# Patient Record
Sex: Male | Born: 2000 | Race: White | Hispanic: No | Marital: Single | Smoking: Never smoker
Health system: Southern US, Community
[De-identification: ages and names within clinical notes are randomized; demographics above are authoritative.]

## PROBLEM LIST (undated history)

## (undated) DIAGNOSIS — S060XAA Concussion with loss of consciousness status unknown, initial encounter: Secondary | ICD-10-CM

## (undated) DIAGNOSIS — S060X9A Concussion with loss of consciousness of unspecified duration, initial encounter: Secondary | ICD-10-CM

## (undated) HISTORY — PX: ADENOIDECTOMY: SHX5191

## (undated) HISTORY — PX: TYMPANOSTOMY TUBE PLACEMENT: SHX32

## (undated) HISTORY — PX: TONSILLECTOMY: SUR1361

## (undated) HISTORY — PX: CIRCUMCISION: SUR203

---
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
## 2022-01-03 ENCOUNTER — Ambulatory Visit: Payer: Self-pay | Admitting: Behavioral Health

## 2022-01-09 ENCOUNTER — Ambulatory Visit (INDEPENDENT_AMBULATORY_CARE_PROVIDER_SITE_OTHER): Payer: 59 | Admitting: Mental Health

## 2022-01-09 ENCOUNTER — Other Ambulatory Visit: Payer: Self-pay

## 2022-01-09 DIAGNOSIS — F331 Major depressive disorder, recurrent, moderate: Secondary | ICD-10-CM | POA: Diagnosis not present
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
## 2022-01-23 ENCOUNTER — Ambulatory Visit: Payer: 59 | Admitting: Mental Health

## 2022-02-05 ENCOUNTER — Ambulatory Visit: Payer: 59 | Admitting: Mental Health

## 2022-02-19 ENCOUNTER — Ambulatory Visit: Payer: 59 | Admitting: Mental Health

## 2022-02-27 ENCOUNTER — Ambulatory Visit: Payer: 59 | Admitting: Mental Health

## 2022-03-12 ENCOUNTER — Other Ambulatory Visit: Payer: Self-pay

## 2022-03-12 ENCOUNTER — Ambulatory Visit (INDEPENDENT_AMBULATORY_CARE_PROVIDER_SITE_OTHER): Payer: 59 | Admitting: Mental Health

## 2022-03-12 DIAGNOSIS — F39 Unspecified mood [affective] disorder: Secondary | ICD-10-CM | POA: Diagnosis not present

## 2022-03-12 DIAGNOSIS — F331 Major depressive disorder, recurrent, moderate: Secondary | ICD-10-CM

## 2022-03-13 NOTE — Progress Notes (Signed)
?    [facility] Counselor/Therapist Progress Note ? ? ?Patient ID: Francisco Harmon, Francisco Harmon"     MRN: [id number] ? ?Date: 03/12/22 ? ?Timespent: 51 minutes ? ?Treatment Type: Individual Therapy ? ?Interventions:Solution Focused, Strength-based and Supportive ? ?Mental Status Exam: ?  ? ?Appearance:   Casual     ?Behavior:  Appropriate  ?Motor:  Normal  ?Speech/Language:   Normal Rate  ?Affect:    full range  ?Mood:   euthymic  ?Thought process:  Coherent and Relevant  ?Thought content:    Logical  ?Perceptual disturbances:    None  ?Orientation:  Full (Time, Place, and Person)  ?Attention:  Good  ?Concentration:  good  ?Memory:  Recent  ?Fund of knowledge:   Good  ?Insight:     good  ?Judgment:    Good  ?Impulse Control:   good  ? ?Reported Symptoms: mood swings w/ depressed mood, hopeless/helpless feelings,negative self talk, low motivation, anxiety, low self-esteem, irritability, grandiosity, impulsivity, emotional numbing ? ?Risk Assessment: ?Danger to Self:  No ?Self-injurious Behavior: No ?Danger to Others: No ?Duty to Warn:no ?Physical Aggression / Violence:No  ?Access to Firearms a concern: No  ?Gang Involvement:No   ?Patient / guardian was educated about steps to take if suicide or homicide risk level increases between visits. ?While future psychiatric events cannot be accurately predicted, the patient does not currently require acute inpatient psychiatric care and does not currently meet [facility] involuntary commitment criteria. ? ? ?Subjective  Patient presents on time for today session.  Patient shared events since last visit.  He stated that he is getting along better with his family, continues to live with his biological father, stepmother and stepsisters.  He shared many events they shared together, relationship changes which he feels at this point are moving in a positive direction.  He stated he is now working up to 30 hours/week at a [employer], while also attending and completing  his high school course work towards graduation.  He continues to be excited about attending college next fall.  He stated that he has discontinued taking his medication as he felt that he was more irritable as a result.  He stated that he has had no significant mood instability of concern, no severe depressive episodes.  States that he felt "manic" a few weeks ago but was able to ground himself by utilizing some cognitive skills as discussed in past sessions, reframing.  He continues to use cannabis a few times per week, states that he feels it is helpful but does not want to use it daily.  He went on to share how he set a boundary with one of his past friends going into detail about how he was able to be more assertive and "not get run over by people" as he stated this would often occur in the past.  Encouraged self-care, getting adequate rest and reviewed continued journaling as well as reaching out to this office between sessions if needed. ? ?Interventions: Strength-based and Supportive therapy ? ?Diagnosis: ?  ICD-10-CM   ?1. Major depressive disorder, recurrent episode, moderate (HCC)  F33.1   ?  ?2. 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  ? ? ? ? ?Plan:   Patient to utilize his support system if symptoms worsen between symptoms as well as contacting this office if needed between sessions. Paitent to continue to identify thoughts that increase his feelings depressed/anxious.  Continue to utilize support system. ? ?Long-term goal:   ?  Reduce overall level, frequency, and intensity of the feelings of depression and anxiety in severity for at least 3 consecutive months w/ a decrease in symptoms and mood instability.  ?  ?Short-term goal:  ?Engage in psychiatric assessment  ?Decrease negative self talk associated w/ depression and anxious mood states ?Increase mood stability by engaging in a consistent sleep and dietary routine ?Journal between sessions ?Utilize his support system as needed between sessions and  contact this office with immediate needs or concerns ?  ?Assessment of progress:  progressing ? ?Anson Oregon, [facility] ? ? ? ? ?

## 2022-03-27 ENCOUNTER — Ambulatory Visit: Payer: 59 | Admitting: Mental Health

## 2022-03-28 NOTE — Progress Notes (Signed)
No charge for missed session. ?

## 2022-04-19 ENCOUNTER — Ambulatory Visit (INDEPENDENT_AMBULATORY_CARE_PROVIDER_SITE_OTHER): Payer: 59 | Admitting: Mental Health

## 2022-04-19 DIAGNOSIS — F39 Unspecified mood [affective] disorder: Secondary | ICD-10-CM | POA: Diagnosis not present

## 2022-04-19 NOTE — Progress Notes (Signed)
?    [facility] Counselor/Therapist Progress Note ? ? ?Patient ID: Francisco Harmon, Francisco Harmon"     MRN: [id number] ? ?Date: 04/20/22 ? ?Timespent: 52 minutes ? ?Treatment Type: Individual Therapy ? ?Interventions:Solution Focused, Strength-based and Supportive ? ?Mental Status Exam: ?  ? ?Appearance:   Casual     ?Behavior:  Appropriate  ?Motor:  Normal  ?Speech/Language:   Normal Rate  ?Affect:    full range  ?Mood:   euthymic  ?Thought process:  Coherent and Relevant  ?Thought content:    Logical  ?Perceptual disturbances:    None  ?Orientation:  Full (Time, Place, and Person)  ?Attention:  Good  ?Concentration:  good  ?Memory:  Recent  ?Fund of knowledge:   Good  ?Insight:     good  ?Judgment:    Good  ?Impulse Control:   good  ? ?Reported Symptoms: mood swings w/ depressed mood, hopeless/helpless feelings,negative self talk, low motivation, anxiety, low self-esteem, irritability, grandiosity, impulsivity, emotional numbing ? ?Risk Assessment: ?Danger to Self:  No ?Self-injurious Behavior: No ?Danger to Others: No ?Duty to Warn:no ?Physical Aggression / Violence:No  ?Access to Firearms a concern: No  ?Gang Involvement:No   ?Patient / guardian was educated about steps to take if suicide or homicide risk level increases between visits. ?While future psychiatric events cannot be accurately predicted, the patient does not currently require acute inpatient psychiatric care and does not currently meet [facility] involuntary commitment criteria. ? ? ?Subjective  Patient presents on time for today session.  Patient shared recent events, progress.  He stated that he has felt "manic" describing his mood over the last few days.  He stated that he enjoyed going to prom with his friend, shared some experiences.  He states that he has been sleeping an average of about 4 hours per night, last night getting almost 8 hours.  Reports some elevated energy levels over the past few days.  Using cannabis at times to assist in  managing his mood towards a more relaxed calming state.  Shared relationships, getting along well with his closest friends, his best friend having a birthday today with which she is excited to give him his gift.  Denies feeling depressed recently, continues to keep up with academic demands and looks forward to next year attending college.  Facilitated his exploring and identifying some signs and symptoms where he can recognize if he is starting to feel a change in his mood toward having higher energy.  He stated that he tries to "check himself" from a cognitive standpoint before making decisions impulsively.  Encouraged him to continue to pause to identify thoughts related to his mood and the situations towards giving himself time to make decisions in a thoughtful manner. ? ?Interventions: Strength-based and Supportive therapy ? ?Diagnosis: ?  ICD-10-CM   ?1. 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  ? ? ? ?Plan:   Patient to utilize his support system if symptoms worsen between symptoms as well as contacting this office if needed between sessions. Paitent to continue to identify thoughts that increase his feelings depressed/anxious.  Continue to utilize support system. ? ?Long-term goal:   ?Reduce overall level, frequency, and intensity of the feelings of depression and anxiety in severity for at least 3 consecutive months w/ a decrease in symptoms and mood instability.  ?  ?Short-term goal:  ?Engage in psychiatric assessment  ?Decrease negative self talk associated w/ depression and anxious mood states ?Increase mood stability by engaging  in a consistent sleep and dietary routine ?Journal between sessions ?Utilize his support system as needed between sessions and contact this office with immediate needs or concerns ?  ?Assessment of progress:  progressing ? ?Anson [place], [facility] ? ? ? ? ?

## 2022-05-08 ENCOUNTER — Ambulatory Visit: Payer: 59 | Admitting: Mental Health

## 2022-05-30 ENCOUNTER — Ambulatory Visit (INDEPENDENT_AMBULATORY_CARE_PROVIDER_SITE_OTHER): Payer: Self-pay | Admitting: Mental Health

## 2022-05-30 DIAGNOSIS — F39 Unspecified mood [affective] disorder: Secondary | ICD-10-CM

## 2022-05-30 NOTE — Progress Notes (Signed)
No show charge for 05/30/22.

## 2022-06-18 ENCOUNTER — Ambulatory Visit (INDEPENDENT_AMBULATORY_CARE_PROVIDER_SITE_OTHER): Payer: 59 | Admitting: Mental Health

## 2022-06-18 DIAGNOSIS — F39 Unspecified mood [affective] disorder: Secondary | ICD-10-CM | POA: Diagnosis not present

## 2022-07-09 ENCOUNTER — Ambulatory Visit (INDEPENDENT_AMBULATORY_CARE_PROVIDER_SITE_OTHER): Payer: 59 | Admitting: Mental Health

## 2022-07-09 DIAGNOSIS — F39 Unspecified mood [affective] disorder: Secondary | ICD-10-CM

## 2022-07-09 NOTE — Progress Notes (Signed)
[facility] Counselor/Therapist Progress Note   Patient ID: Francisco Harmon, Francisco Harmon"     MRN: [id number]  Date: 07/09/22  Timespent: 54 minutes  Treatment Type: Individual Therapy  Interventions:Solution Focused, Strength-based and Supportive  Mental Status Exam:    Appearance:   Casual     Behavior:  Appropriate  Motor:  Normal  Speech/Language:   Normal Rate  Affect:    full range  Mood:   euthymic  Thought process:  Coherent and Relevant  Thought content:    Logical  Perceptual disturbances:    None  Orientation:  Full (Time, Place, and Person)  Attention:  Good  Concentration:  good  Memory:  Recent  Fund of knowledge:   Good  Insight:     good  Judgment:    Good  Impulse Control:   good   Reported Symptoms: mood swings w/ depressed mood, hopeless/helpless feelings,negative self talk, low motivation, anxiety, low self-esteem, irritability, grandiosity, impulsivity, emotional numbing  Risk Assessment: Danger to Self:  No Self-injurious Behavior: No Danger to Others: No Duty to Warn:no Physical Aggression / Violence:No  Access to Firearms a concern: No  Gang Involvement:No   Patient / guardian was educated about steps to take if suicide or homicide risk level increases between visits. While future psychiatric events cannot be accurately predicted, the patient does not currently require acute inpatient psychiatric care and does not currently meet [facility] involuntary commitment criteria.   Subjective  Patient presents on time for today session.  Patient shared recent progress.  He stated that he was able to go on to college campus to her and is looking forward to attending in the fall.  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  Assessed mood where he stated that he has been feeling more manic over the past several days, went on to state how he  takes time to recognize symptoms shared in session.  Patient stated that he continues to use cannabis at times and this helps to calm his mood.  He stated that his shift to his mood becoming more depressed is typically more consistent but has not been severe.  Family relationships were assessed along with peer interactions, specifically without of his girlfriend as they are now dating as a couple.  He continues to identify some anxiety related to when he goes to college and she goes to a different college in the fall, he is hopeful they can continue the relationship but also recognizes that change could occur.  Facilitated his identifying thoughts, feelings as well as needs throughout the session.   Interventions: Strength-based and Supportive therapy  Diagnosis:   ICD-10-CM   1. Episodic mood disorder (HCC)  F39          Plan:   Patient to utilize his support system if symptoms worsen between symptoms as well as contacting this office if needed between sessions. Paitent to continue to identify thoughts that increase his feelings depressed/anxious.  Continue to utilize support system.  Long-term goal:   Reduce overall level, frequency, and intensity of the feelings of depression and anxiety in severity for at least 3 consecutive months w/ a decrease in symptoms and mood instability.    Short-term goal:  Engage in psychiatric assessment  Decrease negative self talk associated w/ depression and anxious mood states Increase mood stability by engaging in a  consistent sleep and dietary routine Journal between sessions Utilize his support system as needed between sessions and contact this office with immediate needs or concerns   Assessment of progress:  progressing  [name] Session, [facility]

## 2022-07-26 ENCOUNTER — Ambulatory Visit: Payer: 59 | Admitting: Mental Health

## 2022-08-01 ENCOUNTER — Ambulatory Visit (INDEPENDENT_AMBULATORY_CARE_PROVIDER_SITE_OTHER): Payer: 59 | Admitting: Mental Health

## 2022-08-01 DIAGNOSIS — F39 Unspecified mood [affective] disorder: Secondary | ICD-10-CM

## 2022-09-09 NOTE — Progress Notes (Signed)
      [facility] Counselor/Therapist Progress Note   Patient ID: Francisco Harmon, Francisco Harmon"     MRN: [id number]  Date: 08/01/22  Timespent: 55 minutes  Treatment Type: Individual Therapy  Interventions:Solution Focused, Strength-based and Supportive  Mental Status Exam:    Appearance:   Casual     Behavior:  Appropriate  Motor:  Normal  Speech/Language:   Normal Rate  Affect:    full range  Mood:   euthymic  Thought process:  Coherent and Relevant  Thought content:    Logical  Perceptual disturbances:    None  Orientation:  Full (Time, Place, and Person)  Attention:  Good  Concentration:  good  Memory:  Recent  Fund of knowledge:   Good  Insight:     good  Judgment:    Good  Impulse Control:   good   Reported Symptoms: mood swings w/ depressed mood, hopeless/helpless feelings,negative self talk, low motivation, anxiety, low self-esteem, irritability, grandiosity, impulsivity, emotional numbing  Risk Assessment: Danger to Self:  No Self-injurious Behavior: No Danger to Others: No Duty to Warn:no Physical Aggression / Violence:No  Access to Firearms a concern: No  Gang Involvement:No   Patient / guardian was educated about steps to take if suicide or homicide risk level increases between visits. While future psychiatric events cannot be accurately predicted, the patient does not currently require acute inpatient psychiatric care and does not currently meet [facility] involuntary commitment criteria.   Subjective  Patient presents on time for today session.  Patient shared changes, how he and his girlfriend recently broke up.  Provide support as patient went on to share thoughts, feelings related where he ultimately feels it may be for the best as they will be attending to different colleges, along with other reasons.  Patient expressed some anxiety related to being ready for college, financially he is getting support from his father however, there are some deadlines  recently and he needs his father to follow through as planned.  Patient shared how he has some anxiety related to going to college, albeit excited as well.  We explored, facilitating patient further identifying thoughts related to this anxiety as well as assisting him in reframing with a focus on recognizing his ability to be adaptable and maintaining high grades historically.  Facilitated his sharing plans for the adjustment when going to college.  Encouraged him to reach out for follow-up sessions once settled into our office between sessions if needed.    Interventions: Strength-based and Supportive therapy  Diagnosis:   ICD-10-CM   1. Episodic mood disorder ([place])  F39           Plan:   Patient to utilize his support system if symptoms worsen between symptoms as well as contacting this office if needed between sessions. Paitent to continue to identify thoughts that increase his feelings depressed/anxious.  Continue to utilize support system.  Long-term goal:   Reduce overall level, frequency, and intensity of the feelings of depression and anxiety in severity for at least 3 consecutive months w/ a decrease in symptoms and mood instability.    Short-term goal:  Engage in psychiatric assessment  Decrease negative self talk associated w/ depression and anxious mood states Increase mood stability by engaging in a consistent sleep and dietary routine Journal between sessions Utilize his support system as needed between sessions and contact this office with immediate needs or concerns   Assessment of progress:  progressing  [place], [facility]

## 2023-08-21 ENCOUNTER — Telehealth: Payer: Self-pay | Admitting: Mental Health

## 2023-08-21 NOTE — Telephone Encounter (Signed)
PT called and LM around 12:00. He is asking for a call from [place]. No reason given.

## 2023-08-27 ENCOUNTER — Telehealth: Payer: Self-pay | Admitting: Mental Health

## 2023-08-27 NOTE — Telephone Encounter (Signed)
Pt communicated with [name] about documentation he needs for an emotional support animal. Paperwork placed in Chris's box. Will need to pay for letter still.
